# Patient Record
Sex: Female | Born: 2005 | Race: White | Hispanic: No | Marital: Single | Smoking: Never smoker
Health system: Southern US, Community
[De-identification: ages and names within clinical notes are randomized; demographics above are authoritative.]

## PROBLEM LIST (undated history)

## (undated) DIAGNOSIS — Z789 Other specified health status: Secondary | ICD-10-CM

## (undated) DIAGNOSIS — R519 Headache, unspecified: Secondary | ICD-10-CM

## (undated) HISTORY — DX: Headache, unspecified: R51.9

---
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
## 2023-01-08 ENCOUNTER — Telehealth: Payer: Self-pay | Admitting: Family Medicine

## 2023-01-08 DIAGNOSIS — E559 Vitamin D deficiency, unspecified: Secondary | ICD-10-CM

## 2023-01-08 DIAGNOSIS — R519 Headache, unspecified: Secondary | ICD-10-CM

## 2023-01-08 DIAGNOSIS — Z131 Encounter for screening for diabetes mellitus: Secondary | ICD-10-CM

## 2023-01-08 NOTE — Telephone Encounter (Signed)
-----   Message from Alvina Chou sent at 01/07/2023  2:13 PM EDT ----- Regarding: Lab orders for Monday, 6.10.24 Patient is scheduled for CPX labs, please order future labs, Thanks , Camelia Eng

## 2023-01-08 NOTE — Telephone Encounter (Addendum)
Left message for Yvonne Zhang to return my call in regards to upcoming lab appointment.  I have cancelled her lab appointment prior to her physical.  Due to her age no pre-physical labs are needed.

## 2023-01-08 NOTE — Telephone Encounter (Signed)
Labs not done usually routinely at this age group.  If she is interested in sexually-transmitted infection testing we can do this as well as vitamin D testing ahead of time.  Otherwise cancel lab appointment

## 2023-01-09 NOTE — Telephone Encounter (Signed)
Mom called back after speaking with [place].  [place] thought Dr. Ermalene Searing had wanted her to come in for labs prior to her CPE because she wanted do run labs in regards to her headaches she's beem having.   She has been under care of a neurologist since she was 17 years old.    Mom also brought up the diabetes concern again.  Please advise.

## 2023-01-09 NOTE — Telephone Encounter (Signed)
Yvonne Zhang's mom notified about lab appointment not needed prior to appointment.  Mom did ask if her blood sugar would be checked at her appointment because her dad is diabetic.  Will have Dr. Ermalene Searing address this at her appointment.  FYI to Dr. Ermalene Searing.

## 2023-01-10 DIAGNOSIS — Z131 Encounter for screening for diabetes mellitus: Secondary | ICD-10-CM | POA: Insufficient documentation

## 2023-01-10 NOTE — Telephone Encounter (Signed)
Okay, let patient know that I have ordered labs.  She can keep the lab appointment prior to her physical.

## 2023-01-10 NOTE — Telephone Encounter (Signed)
Called and notified patients mother, rescheduled lab appt for 01/21/23 @ 7:30 as her previous appt was canceled.

## 2023-01-10 NOTE — Addendum Note (Signed)
Addended by: Kerby Nora E on: 01/10/2023 09:30 AM   Modules accepted: Orders

## 2023-01-21 ENCOUNTER — Other Ambulatory Visit (INDEPENDENT_AMBULATORY_CARE_PROVIDER_SITE_OTHER): Payer: BC Managed Care – PPO

## 2023-01-21 ENCOUNTER — Other Ambulatory Visit: Payer: BC Managed Care – PPO

## 2023-01-21 DIAGNOSIS — Z131 Encounter for screening for diabetes mellitus: Secondary | ICD-10-CM

## 2023-01-21 DIAGNOSIS — E559 Vitamin D deficiency, unspecified: Secondary | ICD-10-CM

## 2023-01-21 DIAGNOSIS — R519 Headache, unspecified: Secondary | ICD-10-CM

## 2023-01-21 DIAGNOSIS — G8929 Other chronic pain: Secondary | ICD-10-CM

## 2023-01-21 LAB — IBC + FERRITIN
Ferritin: 21.3 ng/mL (ref 10.0–291.0)
Iron: 92 ug/dL (ref 42–145)
Saturation Ratios: 23 % (ref 20.0–50.0)
TIBC: 400.4 ug/dL (ref 250.0–450.0)
Transferrin: 286 mg/dL (ref 212.0–360.0)

## 2023-01-21 LAB — COMPREHENSIVE METABOLIC PANEL
ALT: 30 U/L (ref 0–35)
AST: 25 U/L (ref 0–37)
Albumin: 4.6 g/dL (ref 3.5–5.2)
Alkaline Phosphatase: 51 U/L (ref 47–119)
BUN: 15 mg/dL (ref 6–23)
CO2: 27 mEq/L (ref 19–32)
Calcium: 9.3 mg/dL (ref 8.4–10.5)
Chloride: 103 mEq/L (ref 96–112)
Creatinine, Ser: 0.89 mg/dL (ref 0.40–1.20)
GFR: 94.07 mL/min (ref >60.00)
Glucose, Bld: 94 mg/dL (ref 70–99)
Potassium: 3.8 mEq/L (ref 3.5–5.1)
Sodium: 138 mEq/L (ref 135–145)
Total Bilirubin: 0.9 mg/dL (ref 0.2–1.2)
Total Protein: 7 g/dL (ref 6.0–8.3)

## 2023-01-21 LAB — TSH: TSH: 2.07 u[IU]/mL (ref 0.40–5.00)

## 2023-01-21 LAB — CBC WITH DIFFERENTIAL/PLATELET
Basophils Absolute: 0.1 10*3/uL (ref 0.0–0.1)
Basophils Relative: 0.4 % (ref 0.0–3.0)
Eosinophils Absolute: 0.2 10*3/uL (ref 0.0–0.7)
Eosinophils Relative: 1.4 % (ref 0.0–5.0)
HCT: 40.2 % (ref 36.0–49.0)
Hemoglobin: 13.3 g/dL (ref 12.0–16.0)
Lymphocytes Relative: 26.4 % (ref 24.0–48.0)
Lymphs Abs: 3 10*3/uL (ref 0.7–4.0)
MCHC: 33 g/dL (ref 31.0–37.0)
MCV: 83.7 fl (ref 78.0–98.0)
Monocytes Absolute: 0.8 10*3/uL (ref 0.1–1.0)
Monocytes Relative: 7 % (ref 3.0–12.0)
Neutro Abs: 7.3 10*3/uL (ref 1.4–7.7)
Neutrophils Relative %: 64.8 % (ref 43.0–71.0)
Platelets: 236 10*3/uL (ref 150.0–575.0)
RBC: 4.8 Mil/uL (ref 3.80–5.70)
RDW: 13 % (ref 11.4–15.5)
WBC: 11.4 10*3/uL (ref 4.5–13.5)

## 2023-01-21 LAB — VITAMIN D 25 HYDROXY (VIT D DEFICIENCY, FRACTURES): VITD: 29.54 ng/mL — ABNORMAL LOW (ref 30.00–100.00)

## 2023-01-21 LAB — HEMOGLOBIN A1C: Hgb A1c MFr Bld: 5.6 % (ref 4.6–6.5)

## 2023-01-21 NOTE — Progress Notes (Signed)
No critical labs need to be addressed urgently. We will discuss labs in detail at upcoming office visit.   

## 2023-01-28 ENCOUNTER — Encounter: Payer: BC Managed Care – PPO | Admitting: Family Medicine

## 2023-02-04 ENCOUNTER — Encounter: Payer: Self-pay | Admitting: Family Medicine

## 2023-02-04 ENCOUNTER — Ambulatory Visit (INDEPENDENT_AMBULATORY_CARE_PROVIDER_SITE_OTHER): Payer: BC Managed Care – PPO | Admitting: Family Medicine

## 2023-02-04 VITALS — BP 100/60 | HR 97 | Temp 97.8°F | Ht 67.75 in | Wt 178.4 lb

## 2023-02-04 DIAGNOSIS — G8929 Other chronic pain: Secondary | ICD-10-CM | POA: Diagnosis not present

## 2023-02-04 DIAGNOSIS — E559 Vitamin D deficiency, unspecified: Secondary | ICD-10-CM | POA: Diagnosis not present

## 2023-02-04 DIAGNOSIS — R519 Headache, unspecified: Secondary | ICD-10-CM

## 2023-02-04 DIAGNOSIS — Z0000 Encounter for general adult medical examination without abnormal findings: Secondary | ICD-10-CM

## 2023-02-04 NOTE — Progress Notes (Signed)
Patient ID: Yvonne Zhang, female    DOB: Jun 14, 2006, 17 y.o.   MRN: [id number]  This visit was conducted in person.  BP 100/60 (BP Location: Left Arm, Patient Position: Sitting, Cuff Size: Normal)   Pulse 97   Temp 97.8 F (36.6 C) (Temporal)   Ht 5' 7.75" (1.721 m)   Wt 178 lb 6 oz (80.9 kg)   LMP 02/01/2023   SpO2 97%   BMI 27.32 kg/m    CC:  Chief Complaint  Patient presents with   Annual Exam    Subjective:   HPI: Yvonne Zhang is a 17 y.o. female presenting on 02/04/2023 for Annual Exam  Previous PCP Dr. 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  Started Topamax but had   stop given SE.  Recommended ophthalmologist... had appt 2023 no swelling optic nerve    diagnosed with IIH in 2018. Opening pressure at that time was 29.    She reports no recent headaches.  She is on summer break, relaxing.  Starting to get back into exercise routine.  Relevant past medical, surgical, family and social history reviewed and updated as indicated. Interim medical history since our last visit reviewed. Allergies and medications reviewed and updated. Outpatient Medications Prior to Visit  Medication Sig Dispense Refill   acetaminophen (TYLENOL) 325 MG tablet Take 2 tablets (650 mg total) by mouth every 6 (six) hours as needed.     Ascorbic Acid (VITAMIN C GUMMIE PO) Take 2 each by mouth daily.     fexofenadine (ALLEGRA) 30 MG/5ML suspension Take 60 mg by mouth daily as needed (for allergies).     ibuprofen (ADVIL,MOTRIN) 200 MG tablet Take 2 tablets (400 mg total) by mouth every 6 (six) hours as needed (for headaches or pain). 30 tablet 0   rizatriptan  (MAXALT) 10 MG tablet Take 1 tablet (10 mg total) by mouth as needed. May repeat in 2 hours if needed. Max dose 2 pills in 24 hours 10 tablet 6   Cholecalciferol (VITAMIN D3) 2000 units TABS Take 1 tablet by mouth daily.     No facility-administered medications prior to visit.     Per HPI unless specifically indicated in ROS section below Review of Systems  Constitutional:  Negative for fatigue and fever.  HENT:  Negative for ear pain.   Eyes:  Negative for pain.  Respiratory:  Negative for chest tightness and shortness of breath.   Cardiovascular:  Negative for chest pain, palpitations and leg swelling.  Gastrointestinal:  Negative for abdominal pain.  Genitourinary:  Negative for dysuria.   Objective:  BP 100/60 (BP Location: Left Arm, Patient Position: Sitting, Cuff Size: Normal)   Pulse 97   Temp 97.8 F (36.6 C) (Temporal)   Ht 5' 7.75" (1.721 m)   Wt 178 lb 6 oz (80.9 kg)   LMP 02/01/2023   SpO2 97%   BMI 27.32 kg/m   Wt Readings from Last 3 Encounters:  02/04/23 178 lb 6 oz (80.9 kg) (94 %, Z= 1.58)*  07/29/22 166 lb 12.8 oz (75.7 kg) (91 %, Z= 1.37)*  07/26/22 164 lb 6 oz (74.6 kg) (91 %, Z= 1.32)*   *  Growth percentiles are based on CDC (Girls, 2-20 Years) data.      Physical Exam Vitals and nursing note reviewed.  Constitutional:      General: She is not in acute distress.    Appearance: Normal appearance. She is well-developed. She is not ill-appearing or toxic-appearing.  HENT:     Head: Normocephalic.     Right Ear: Hearing, tympanic membrane, ear canal and external ear normal.     Left Ear: Hearing, tympanic membrane, ear canal and external ear normal.     Nose: Nose normal.  Eyes:     General: Lids are normal. Lids are everted, no foreign bodies appreciated.     Conjunctiva/sclera: Conjunctivae normal.     Pupils: Pupils are equal, round, and reactive to light.  Neck:     Thyroid: No thyroid mass or thyromegaly.     Vascular: No carotid bruit.      Trachea: Trachea normal.  Cardiovascular:     Rate and Rhythm: Normal rate and regular rhythm.     Heart sounds: Normal heart sounds, S1 normal and S2 normal. No murmur heard.    No gallop.  Pulmonary:     Effort: Pulmonary effort is normal. No respiratory distress.     Breath sounds: Normal breath sounds. No wheezing, rhonchi or rales.  Abdominal:     General: Bowel sounds are normal. There is no distension or abdominal bruit.     Palpations: Abdomen is soft. There is no fluid wave or mass.     Tenderness: There is no abdominal tenderness. There is no guarding or rebound.     Hernia: No hernia is present.  Musculoskeletal:     Cervical back: Normal range of motion and neck supple.  Lymphadenopathy:     Cervical: No cervical adenopathy.  Skin:    General: Skin is warm and dry.     Findings: No rash.  Neurological:     Mental Status: She is alert.     Cranial Nerves: No cranial nerve deficit.     Sensory: No sensory deficit.  Psychiatric:        Mood and Affect: Mood is not anxious or depressed.        Speech: Speech normal.        Behavior: Behavior normal. Behavior is cooperative.        Judgment: Judgment normal.       Results for orders placed or performed in visit on 01/21/23  TSH  Result Value Ref Range   TSH 2.07 0.40 - 5.00 uIU/mL  Hemoglobin A1c  Result Value Ref Range   Hgb A1c MFr Bld 5.6 4.6 - 6.5 %  Comprehensive metabolic panel  Result Value Ref Range   Sodium 138 135 - 145 mEq/L   Potassium 3.8 3.5 - 5.1 mEq/L   Chloride 103 96 - 112 mEq/L   CO2 27 19 - 32 mEq/L   Glucose, Bld 94 70 - 99 mg/dL   BUN 15 6 - 23 mg/dL   Creatinine, Ser 0.86 0.40 - 1.20 mg/dL   Total Bilirubin 0.9 0.2 - 1.2 mg/dL   Alkaline Phosphatase 51 47 - 119 U/L   AST 25 0 - 37 U/L   ALT 30 0 - 35 U/L   Total Protein 7.0 6.0 - 8.3 g/dL   Albumin 4.6 3.5 - 5.2 g/dL   GFR 57.84 >69.62 mL/min   Calcium 9.3 8.4 - 10.5 mg/dL  VITAMIN D 25 Hydroxy (Vit-D Deficiency, Fractures)   Result Value  Ref Range   VITD 29.54 (L) 30.00 - 100.00 ng/mL  IBC + Ferritin  Result Value Ref Range   Iron 92 42 - 145 ug/dL   Transferrin 811.9 147.8 - 360.0 mg/dL   Saturation Ratios 29.5 20.0 - 50.0 %   Ferritin 21.3 10.0 - 291.0 ng/mL   TIBC 400.4 250.0 - 450.0 mcg/dL  CBC with Differential/Platelet  Result Value Ref Range   WBC 11.4 4.5 - 13.5 K/uL   RBC 4.80 3.80 - 5.70 Mil/uL   Hemoglobin 13.3 12.0 - 16.0 g/dL   HCT 62.1 30.8 - 65.7 %   MCV 83.7 78.0 - 98.0 fl   MCHC 33.0 31.0 - 37.0 g/dL   RDW 84.6 96.2 - 95.2 %   Platelets 236.0 150.0 - 575.0 K/uL   Neutrophils Relative % 64.8 43.0 - 71.0 %   Lymphocytes Relative 26.4 24.0 - 48.0 %   Monocytes Relative 7.0 3.0 - 12.0 %   Eosinophils Relative 1.4 0.0 - 5.0 %   Basophils Relative 0.4 0.0 - 3.0 %   Neutro Abs 7.3 1.4 - 7.7 K/uL   Lymphs Abs 3.0 0.7 - 4.0 K/uL   Monocytes Absolute 0.8 0.1 - 1.0 K/uL   Eosinophils Absolute 0.2 0.0 - 0.7 K/uL   Basophils Absolute 0.1 0.0 - 0.1 K/uL     COVID 19 screen:  No recent travel or known exposure to COVID19 The patient denies respiratory symptoms of COVID 19 at this time. The importance of social distancing was discussed today.   Assessment and Plan The patient's preventative maintenance and recommended screening tests for an annual wellness exam were reviewed in full today. Brought up to date unless services declined.  Counselled on the importance of diet, exercise, and its role in overall health and mortality. The patient's FH and SH was reviewed, including their home life, tobacco status, and drug and alcohol status.   Vaccines: Up-to-date with tetanus, flu, COVID vaccine  Pap/DVE: Not indicated till age 29 Mammo: Not indicated  Smoking Status: Non-smoker ETOH/ drug use: None/none  Hep C: Consider at next lab draw  HIV screen: Patient not sexually active. No concern about STD.    Problem List Items Addressed This Visit     Chronic headache disorder     Chronic,  Rare headache.      Vitamin D deficiency     Can  use vi d supplement.      Other Visit Diagnoses     Routine general medical examination at a health care facility    -  Primary       Kerby Nora, MD

## 2023-02-04 NOTE — Assessment & Plan Note (Signed)
Chronic, Rare headache.

## 2023-02-04 NOTE — Assessment & Plan Note (Signed)
Can  use vi d supplement.

## 2023-03-10 NOTE — Progress Notes (Unsigned)
   CC:  headaches  Follow-up Visit  Last visit: 07/29/22  Brief HPI: 17 year old female with a history of IIH (2018, OP 27) who follows in clinic for headaches. MRI brain 12/11/21 was unremarkable. Ophthalmology exam last year was negative for papilledema.   At her last visit she was started on Maxalt for rescue. Interval History: ***   Headache days per month: *** Migraine days per month*** Headache free days per month: ***  Current Headache Regimen: Preventative: *** Abortive: ***   Prior Therapies                                  Advil Maxalt 10 mg PRN Diamox Topamax - paresthesias  Physical Exam:   Vital Signs: There were no vitals taken for this visit. GENERAL:  well appearing, in no acute distress, alert  SKIN:  Color, texture, turgor normal. No rashes or lesions HEAD:  Normocephalic/atraumatic. RESP: normal respiratory effort MSK:  No gross joint deformities.   NEUROLOGICAL: Mental Status: Alert, oriented to person, place and time, Follows commands, and Speech fluent and appropriate. Cranial Nerves: PERRL, face symmetric, no dysarthria, hearing grossly intact Motor: moves all extremities equally Gait: normal-based.  IMPRESSION: ***  PLAN: ***   Follow-up: ***  I spent a total of *** minutes on the date of the service. Headache education was done. Discussed lifestyle modification including increased oral hydration, decreased caffeine, exercise and stress management. Discussed treatment options including preventive and acute medications, natural supplements, and infusion therapy. Discussed medication overuse headache and to limit use of acute treatments to no more than 2 days/week or 10 days/month. Discussed medication side effects, adverse reactions and drug interactions. Written educational materials and patient instructions outlining all of the above were given.  Ocie Doyne, MD

## 2023-03-11 ENCOUNTER — Ambulatory Visit: Payer: BC Managed Care – PPO | Admitting: Psychiatry

## 2023-03-11 ENCOUNTER — Encounter: Payer: Self-pay | Admitting: Psychiatry

## 2023-03-11 VITALS — BP 111/60 | HR 63 | Ht 67.0 in | Wt 184.6 lb

## 2023-03-11 DIAGNOSIS — G43009 Migraine without aura, not intractable, without status migrainosus: Secondary | ICD-10-CM | POA: Diagnosis not present

## 2023-03-11 MED ORDER — RIZATRIPTAN BENZOATE 10 MG PO TABS: 10.0000 mg | ORAL_TABLET | ORAL | 6 refills | Status: DC | PRN

## 2023-03-18 IMAGING — MR MR KNEE*L* W/O CM
4 of 6 series · 23 of 40 positions shown · non-contrast
Comparison: None.

CLINICAL DATA: Left knee pain due to a soccer injury on [DATE],
and notes unable to straighten without any pain to occur.

EXAM:
MRI OF THE LEFT KNEE WITHOUT CONTRAST
TECHNIQUE: Multiplanar, multisequence MR imaging of the knee was performed. No
intravenous contrast was administered.

[Series 10: T2 fat-sat · coronal · 4.0mm · 0.59mm/px · 6 of 26 slices shown (1 of 2)]
[im 1/26]
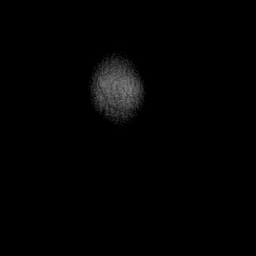
[im 6/26]
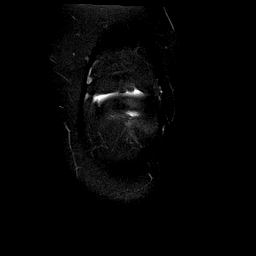
[im 11/26]
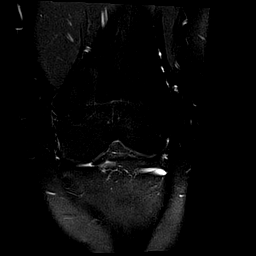
[im 16/26]
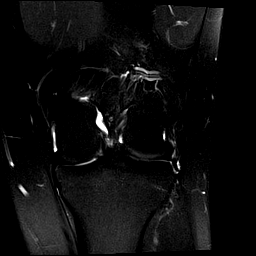
[im 21/26]
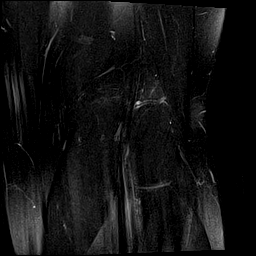
[im 26/26]
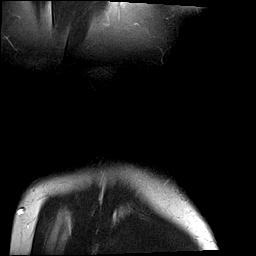

[Series 11: T1 · coronal · 4.0mm · 0.29mm/px · 3 of 26 slices shown]
[im 6/26]
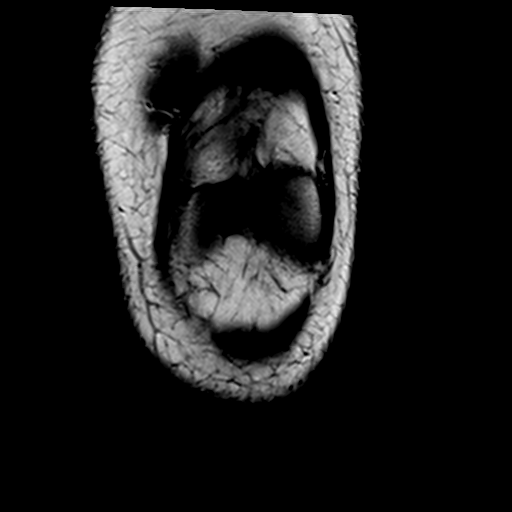
[im 16/26]
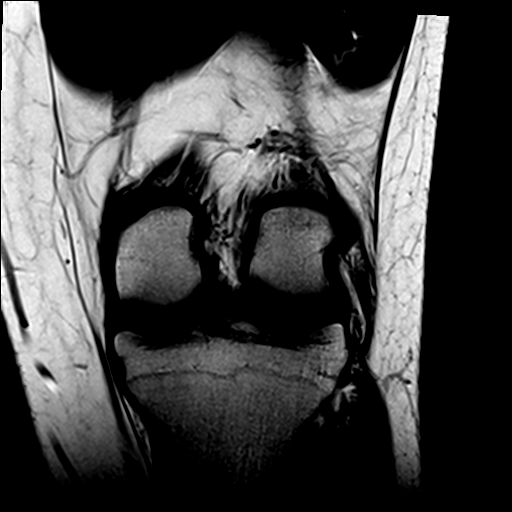
[im 26/26]
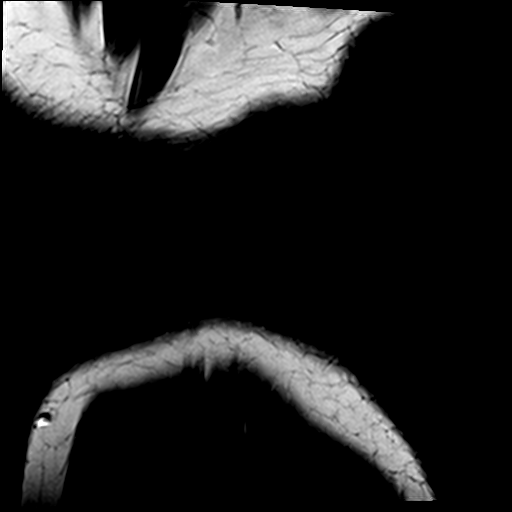

[Series 13: PD fat-sat · sagittal · 3.0mm · 0.29mm/px · 7 of 30 slices shown]
[im 1/30]
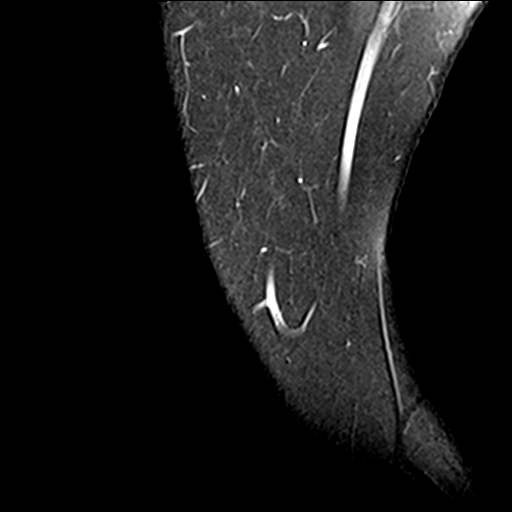
[im 5/30]
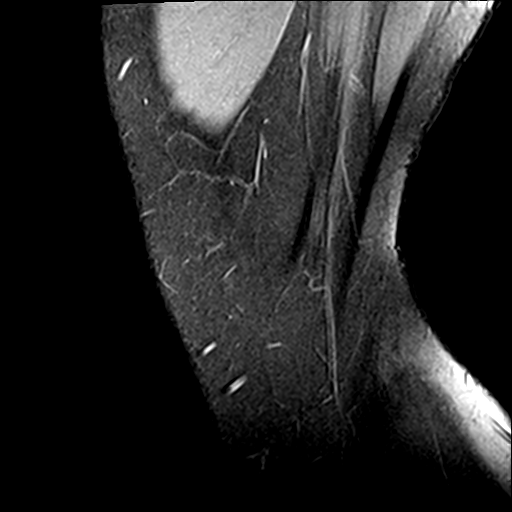
[im 10/30]
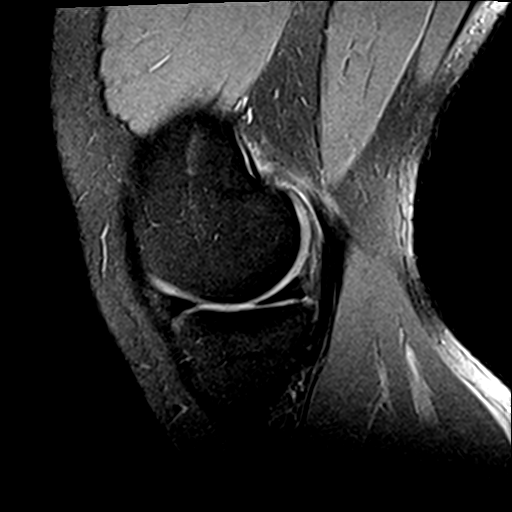
[im 15/30]
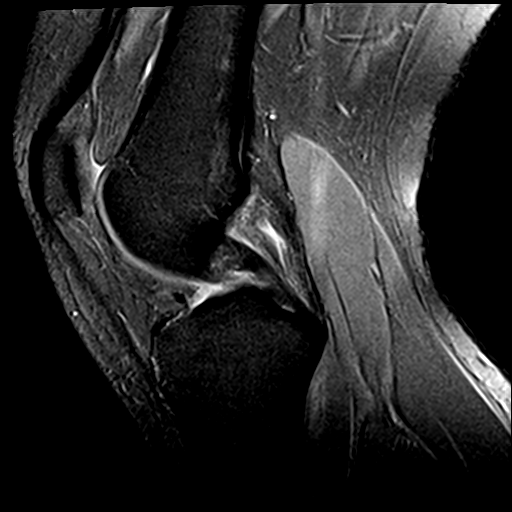
[im 20/30]
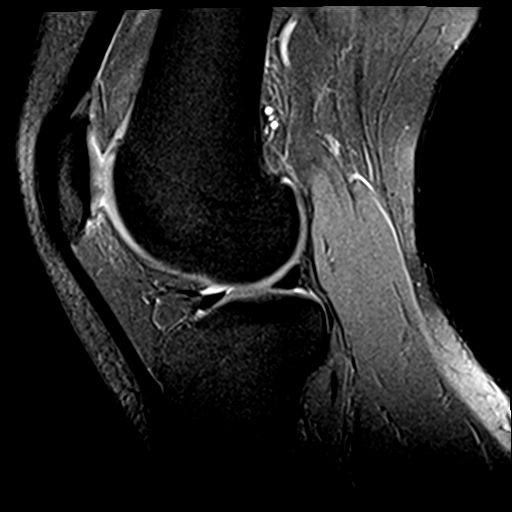
[im 25/30]
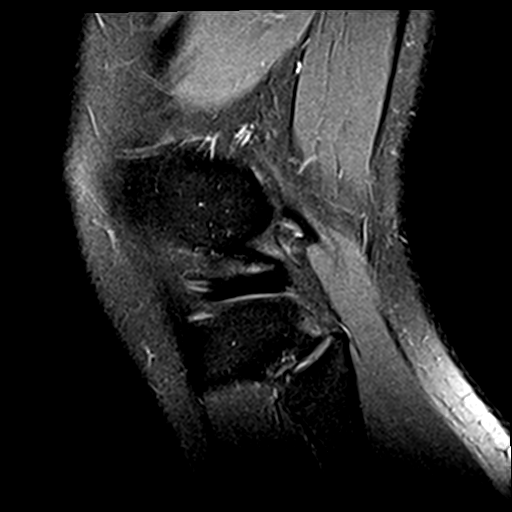
[im 30/30]
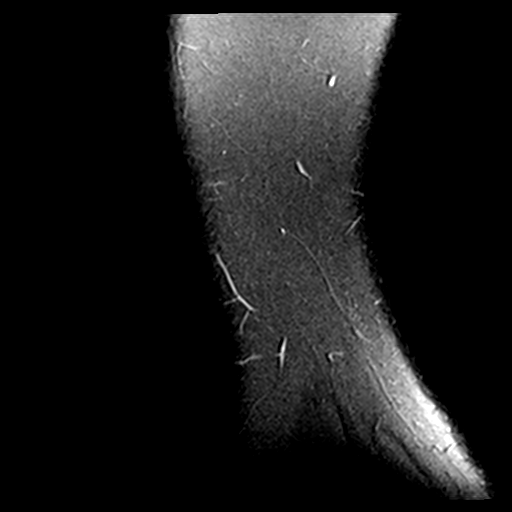

[Series 14: T2 fat-sat · sagittal · 3.0mm · 0.29mm/px · 7 of 30 slices shown (2 of 2)]
[im 1/30]
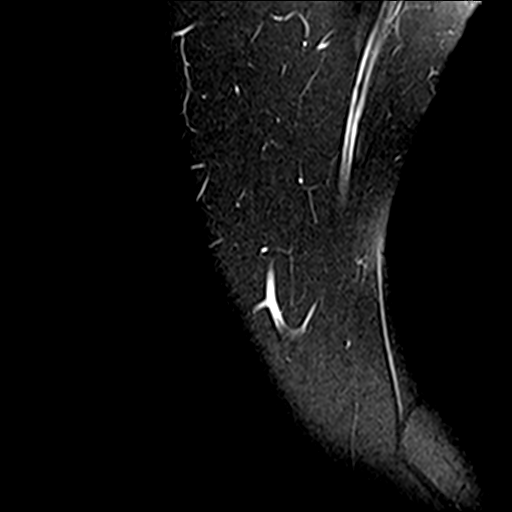
[im 5/30]
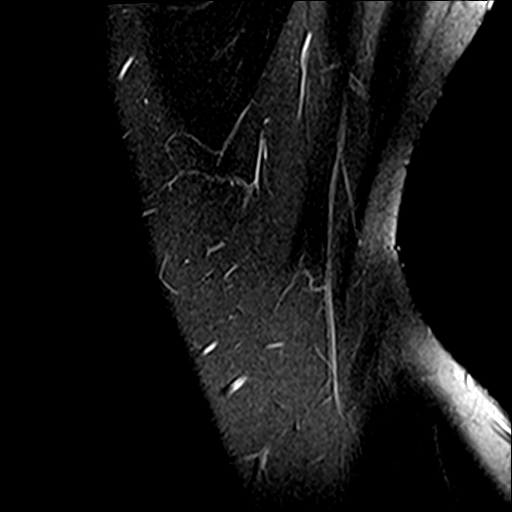
[im 10/30]
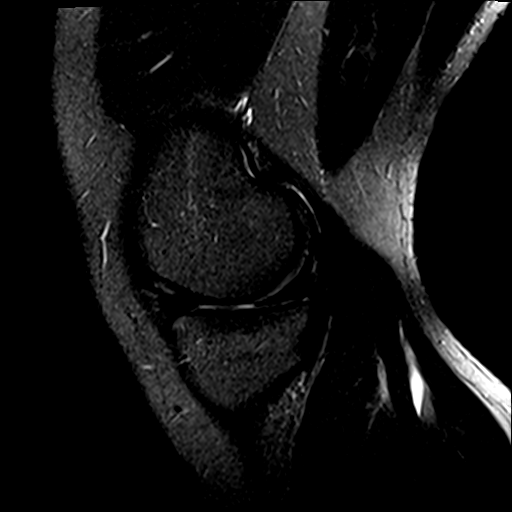
[im 15/30]
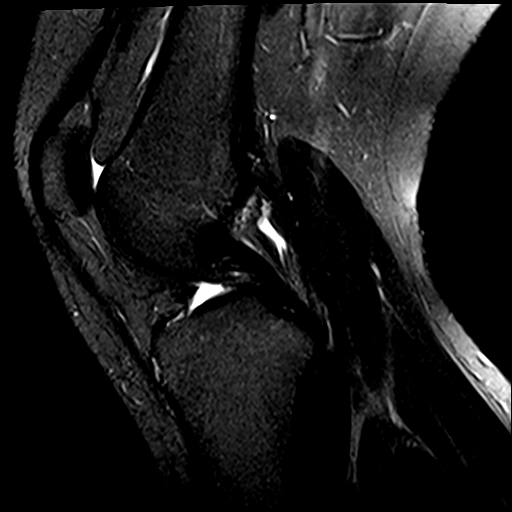
[im 20/30]
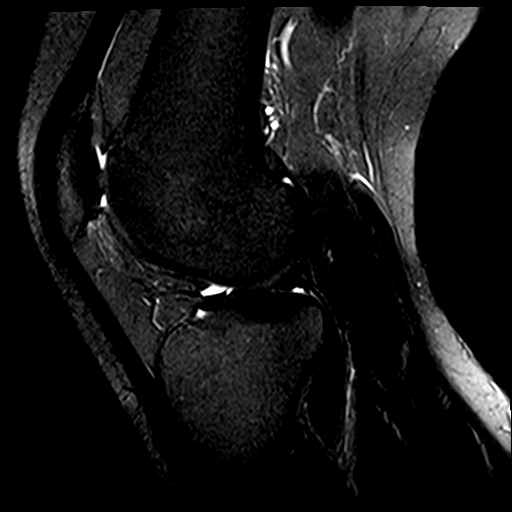
[im 25/30]
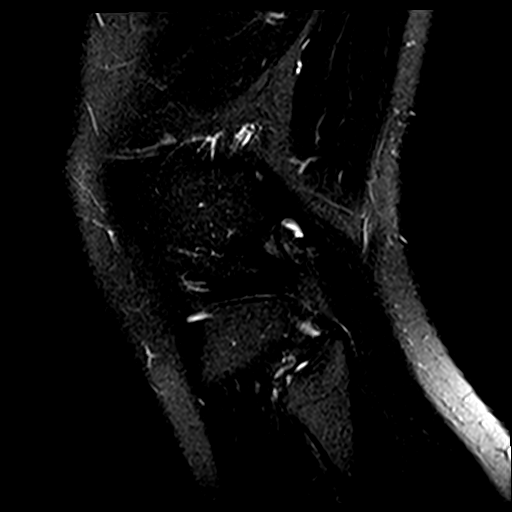
[im 30/30]
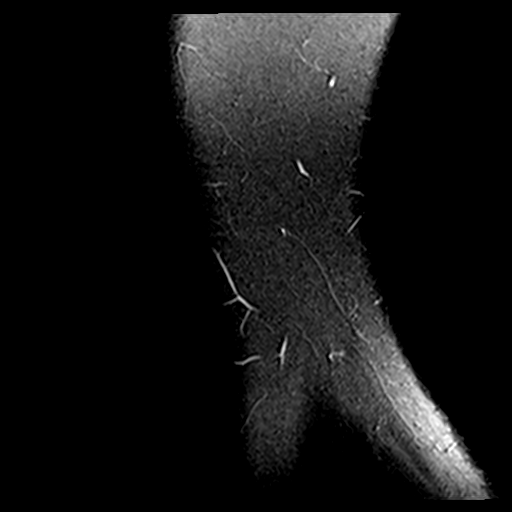

[23 of 40 positions shown; findings below may reference images not displayed]

FINDINGS: MENISCI

Medial: Intact.

Lateral: Intact.

LIGAMENTS

Cruciates: ACL and PCL are intact.

Collaterals: Medial collateral ligament is intact. Lateral
collateral ligament complex is intact.

CARTILAGE

Patellofemoral:  No chondral defect.

Medial:  No chondral defect.

Lateral:  No chondral defect.

JOINT: No joint effusion. Normal Oviraj Bassu. No plical
thickening.

POPLITEAL FOSSA: Popliteus tendon is intact. No Baker's cyst.

EXTENSOR MECHANISM: Intact quadriceps tendon. Intact patellar
tendon. Intact lateral patellar retinaculum. Intact medial patellar
retinaculum. Intact MPFL.

BONES: No aggressive osseous lesion. No fracture or dislocation.

Other: No fluid collection or hematoma. Muscles are normal.
IMPRESSION: Normal MRI of the left knee.

## 2023-06-16 DIAGNOSIS — G43819 Other migraine, intractable, without status migrainosus: Secondary | ICD-10-CM | POA: Diagnosis not present

## 2023-06-16 DIAGNOSIS — J069 Acute upper respiratory infection, unspecified: Secondary | ICD-10-CM | POA: Diagnosis not present

## 2023-06-17 DIAGNOSIS — K529 Noninfective gastroenteritis and colitis, unspecified: Secondary | ICD-10-CM | POA: Diagnosis not present

## 2023-06-20 DIAGNOSIS — R112 Nausea with vomiting, unspecified: Secondary | ICD-10-CM | POA: Diagnosis not present

## 2023-06-20 DIAGNOSIS — R509 Fever, unspecified: Secondary | ICD-10-CM | POA: Diagnosis not present

## 2023-06-20 DIAGNOSIS — J189 Pneumonia, unspecified organism: Secondary | ICD-10-CM | POA: Diagnosis not present

## 2023-06-20 DIAGNOSIS — J157 Pneumonia due to Mycoplasma pneumoniae: Secondary | ICD-10-CM | POA: Diagnosis not present

## 2023-06-20 DIAGNOSIS — R7981 Abnormal blood-gas level: Secondary | ICD-10-CM | POA: Diagnosis not present

## 2023-06-20 DIAGNOSIS — R058 Other specified cough: Secondary | ICD-10-CM | POA: Diagnosis not present

## 2023-06-20 DIAGNOSIS — R110 Nausea: Secondary | ICD-10-CM | POA: Diagnosis not present

## 2023-06-20 DIAGNOSIS — R051 Acute cough: Secondary | ICD-10-CM | POA: Diagnosis not present

## 2023-06-20 DIAGNOSIS — R059 Cough, unspecified: Secondary | ICD-10-CM | POA: Diagnosis not present

## 2023-06-23 DIAGNOSIS — J157 Pneumonia due to Mycoplasma pneumoniae: Secondary | ICD-10-CM | POA: Diagnosis not present

## 2023-06-25 ENCOUNTER — Telehealth: Payer: Self-pay | Admitting: Family Medicine

## 2023-06-25 DIAGNOSIS — M419 Scoliosis, unspecified: Secondary | ICD-10-CM | POA: Diagnosis not present

## 2023-06-25 DIAGNOSIS — J157 Pneumonia due to Mycoplasma pneumoniae: Secondary | ICD-10-CM | POA: Diagnosis not present

## 2023-06-25 NOTE — Telephone Encounter (Signed)
Patient's mom contacted the office, states patient currently has walking pneumonia. Mom says patient went to a health center near school she goes to and that is where this was determined. Health enter has suggested for patient to have another xray between 12/6 and 08/18/2023 to ensure the lungs have cleared. Patient's mom wants to know if patient could have that done here at the office? Wanted to know if Ermalene Searing would want to see patient first to follow up on the symptoms and then order the xray or if Dr. Ermalene Searing could just place the order for her. Mom also says that with the xray they did at the health center they determined that the patient also has scoliosis. Please advise patient's mom with response, okay per DPR.

## 2023-06-25 NOTE — Telephone Encounter (Signed)
I would suggest a follow-up appointment for reassessment  and I can order the chest x-ray at that time.

## 2023-06-25 NOTE — Telephone Encounter (Signed)
We were not able to get scheduled while she is home. I have set her up for appointment on 1/7. She will go to original location for follow up x ray. Mom was concerned that her it showed scoliosis. Mom wanted to know if anything needs to be done about it. Xray report in care everywhere.

## 2023-06-26 NOTE — Telephone Encounter (Signed)
Mrs. Wilbur notified as instructed by telephone.

## 2023-06-26 NOTE — Telephone Encounter (Signed)
I appears the type of scoliosis she is very mild.  Not likely to cause issus but if  chronic back pain let me know.  She should work on core strengthening and posture.

## 2023-08-10 NOTE — Progress Notes (Unsigned)
Chief Complaint  Patient presents with   Follow-up    Pt in room 1 alone.Here for migraine follow up. Pt reports migraines have gotten worse. Pt said she received toradol shot at student health. Pt had 3-4 headaches in last 2 weeks. Pt said she noticed when driving here from school a headaches after couple hours of driving (school is 8 hours away.)    HISTORY OF PRESENT ILLNESS:  08/11/23 ALL:  Yvonne Zhang is a 17 y.o. female here today for follow up for migraines. She was last seen by Dr Delena Bali 02/2023 and reported migraines were well managed on rizatriptan as needed.  She reports an intractable headache 05/2023 not responsive to rizatriptan. She was seen by school MD and headache aborted with Toradol. She reports doing well until the past two weeks. She has had 4 headaches over the past two weeks. She took rizatriptan once and it was effective. She did not bring it home with her for Christmas break. She is currently home from college. 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 No recent ophthalmology exam. No vision changes.    HISTORY (copied from Dr Quentin Mulling previous note)  17 year old female with a history of IIH (2018, OP 82) who follows in clinic for headaches. MRI brain 12/11/21 was unremarkable. Ophthalmology exam in 2022 was negative for papilledema.    At her last visit she was started on Maxalt for rescue.   Interval History: Headaches are improved since her last visit. She has not had a migraine for the past couple of months. Maxalt helps for rescue and she has not had to take a second dose. It does not cause side effects. She does not have any vision changes.     Migraine days per month: 1 Headache free days per month: 29   Current Headache Regimen: Preventative: none Abortive: Maxalt 10 mg PRN     Prior Therapies                                  Advil Maxalt 10 mg PRN Diamox Topamax - paresthesias   REVIEW OF SYSTEMS: Out of a complete 14 system review of symptoms, the patient  complains only of the following symptoms, headaches and all other reviewed systems are negative.   ALLERGIES: Allergies  Allergen Reactions   Other     Seasonal Allergies       HOME MEDICATIONS: Outpatient Medications Prior to Visit  Medication Sig Dispense Refill   acetaminophen (TYLENOL) 325 MG tablet Take 2 tablets (650 mg total) by mouth every 6 (six) hours as needed.     Ascorbic Acid (VITAMIN C GUMMIE PO) Take 2 each by mouth daily.     fexofenadine (ALLEGRA) 30 MG/5ML suspension Take 60 mg by mouth daily as needed (for allergies).     ibuprofen (ADVIL,MOTRIN) 200 MG tablet Take 2 tablets (400 mg total) by mouth every 6 (six) hours as needed (for headaches or pain). 30 tablet 0   rizatriptan (MAXALT) 10 MG tablet Take 1 tablet (10 mg total) by mouth as needed. May repeat in 2 hours if needed. Max dose 2 pills in 24 hours 10 tablet 6   No facility-administered medications prior to visit.     PAST MEDICAL HISTORY: Past Medical History:  Diagnosis Date   Headache    Medical history non-contributory      PAST SURGICAL HISTORY: History reviewed. No pertinent surgical history.  FAMILY HISTORY: Family History  Problem Relation Age of Onset   Diabetes Mother    Hypertension Father    Diabetes Father    Hyperlipidemia Father    Hearing loss Maternal Grandmother    Anxiety disorder Maternal Grandmother    Stroke Maternal Grandmother    Hearing loss Maternal Grandfather    Heart attack Paternal Grandmother    Hearing loss Paternal Grandfather      SOCIAL HISTORY: Social History   Socioeconomic History   Marital status: Single    Spouse name: Not on file   Number of children: 0   Years of education: Not on file   Highest education level: Not on file  Occupational History   Not on file  Tobacco Use   Smoking status: Never   Smokeless tobacco: Never  Substance and Sexual Activity   Alcohol use: No   Drug use: No   Sexual activity: Never  Other Topics  Concern   Not on file  Social History Narrative   Yvonne  goes to [employer], in first year. She is studying  Haematologist, minor in psychology.    Diet:  varied but still fairly limited    Exercise: plays soccer   Social Drivers of Health   Financial Resource Strain: Not on file  Food Insecurity: Not on file  Transportation Needs: Not on file  Physical Activity: Not on file  Stress: Not on file  Social Connections: Unknown (01/28/2023)   Received from [facility], [facility]   Social Connections    How often do you feel lonely or isolated from those around you? (Adult - for ages 18 years and over): Not on file  Intimate Partner Violence: Not on file     PHYSICAL EXAM  Vitals:   08/11/23 1253  BP: 112/73  Pulse: 81  Weight: 179 lb 8 oz (81.4 kg)  Height: 5\' 7"  (1.702 m)   Body mass index is 28.11 kg/m.  Generalized: Well developed, in no acute distress  Cardiology: normal rate and rhythm, no murmur auscultated  Respiratory: clear to auscultation bilaterally    Neurological examination  Mentation: Alert oriented to time, place, history taking. Follows all commands speech and language fluent Cranial nerve II-XII: Pupils were equal round reactive to light. Extraocular movements were full, visual field were full on confrontational test. Facial sensation and strength were normal. Uvula tongue midline. Head turning and shoulder shrug  were normal and symmetric. Motor: The motor testing reveals 5 over 5 strength of all 4 extremities. Good symmetric motor tone is noted throughout.  Gait and station: Gait is normal.    DIAGNOSTIC DATA (LABS, IMAGING, TESTING) - I reviewed patient records, labs, notes, testing and imaging myself where available.  Lab Results  Component Value Date   WBC 11.4 01/21/2023   HGB 13.3 01/21/2023   HCT 40.2 01/21/2023   MCV 83.7 01/21/2023   PLT 236.0 01/21/2023      Component Value Date/Time   NA 138 01/21/2023 0735   K 3.8  01/21/2023 0735   CL 103 01/21/2023 0735   CO2 27 01/21/2023 0735   GLUCOSE 94 01/21/2023 0735   BUN 15 01/21/2023 0735   CREATININE 0.89 01/21/2023 0735   CALCIUM 9.3 01/21/2023 0735   PROT 7.0 01/21/2023 0735   ALBUMIN 4.6 01/21/2023 0735   AST 25 01/21/2023 0735   ALT 30 01/21/2023 0735   ALKPHOS 51 01/21/2023 0735   BILITOT 0.9 01/21/2023 0735   GFRNONAA NOT CALCULATED 10/12/2016 1609  GFRAA NOT CALCULATED 10/12/2016 1609   No results found for: "CHOL", "HDL", "LDLCALC", "LDLDIRECT", "TRIG", "CHOLHDL" Lab Results  Component Value Date   HGBA1C 5.6 01/21/2023   No results found for: "VITAMINB12" Lab Results  Component Value Date   TSH 2.07 01/21/2023        No data to display               No data to display           ASSESSMENT AND PLAN  17 y.o. year old female  has a past medical history of Headache and Medical history non-contributory. here with    Migraine without aura and without status migrainosus, not intractable  Yvonne Zhang reports waxing and waning of headaches, but, overall fairly stable. We will try sumatriptan to see if this may be more effective. Appropriate dosing and possible side effects reviewed. May consider supplements if needed. Healthy lifestyle habits encouraged. She will follow up with PCP as directed. She will return to see me in 1 year, sooner if needed. She verbalizes understanding and agreement with this plan.   No orders of the defined types were placed in this encounter.   Meds ordered this encounter  Medications   SUMAtriptan (IMITREX) 100 MG tablet    Sig: Take 1 tablet (100 mg total) by mouth once as needed for up to 1 dose for migraine. May repeat in 2 hours if headache persists or recurs.    Dispense:  10 tablet    Refill:  11    Supervising Provider:   Anson Fret [[id number]]      Shawnie Dapper, MSN, FNP-C 08/11/2023, 1:57 PM  [facility] [address], [address] [place], [place]  [address] [phone]

## 2023-08-10 NOTE — Patient Instructions (Signed)
Below is our plan:  We will switch rizatriptan to sumatriptan. Please take 1 tablet at onset of headache. May take 1 additional tablet in 2 hours if needed. Do not take more than 2 tablets in 24 hours or more than 10 in a month.   Please make sure you are staying well hydrated. I recommend 50-60 ounces daily. Well balanced diet and regular exercise encouraged. Consistent sleep schedule with 6-8 hours recommended.   Please continue follow up with care team as directed.   Follow up with me in 1 year   You may receive a survey regarding today's visit. I encourage you to leave honest feed back as I do use this information to improve patient care. Thank you for seeing me today!   GENERAL HEADACHE INFORMATION:   Natural supplements: Magnesium Oxide or Magnesium Glycinate 500 mg at bed (up to 800 mg daily) Coenzyme Q10 300 mg in AM Vitamin B2- 200 mg twice a day   Add 1 supplement at a time since even natural supplements can have undesirable side effects. You can sometimes buy supplements cheaper (especially Coenzyme Q10) at www.WebmailGuide.co.za or at [facility].  Migraine with aura: There is increased risk for stroke in women with migraine with aura and a contraindication for the combined contraceptive pill for use by women who have migraine with aura. The risk for women with migraine without aura is lower. However other risk factors like smoking are far more likely to increase stroke risk than migraine. There is a recommendation for no smoking and for the use of OCPs without estrogen such as progestogen only pills particularly for women with migraine with aura.Marland Kitchen People who have migraine headaches with auras may be 3 times more likely to have a stroke caused by a blood clot, compared to migraine patients who don't see auras. Women who take hormone-replacement therapy may be 30 percent more likely to suffer a clot-based stroke than women not taking medication containing estrogen. Other risk factors like smoking  and high blood pressure may be  much more important.    Vitamins and herbs that show potential:   Magnesium: Magnesium (250 mg twice a day or 500 mg at bed) has a relaxant effect on smooth muscles such as blood vessels. Individuals suffering from frequent or daily headache usually have low magnesium levels which can be increase with daily supplementation of 400-750 mg. Three trials found 40-90% average headache reduction  when used as a preventative. Magnesium may help with headaches are aura, the best evidence for magnesium is for migraine with aura is its thought to stop the cortical spreading depression we believe is the pathophysiology of migraine aura.Magnesium also demonstrated the benefit in menstrually related migraine.  Magnesium is part of the messenger system in the serotonin cascade and it is a good muscle relaxant.  It is also useful for constipation which can be a side effect of other medications used to treat migraine. Good sources include nuts, whole grains, and tomatoes. Side Effects: loose stool/diarrhea  Riboflavin (vitamin B 2) 200 mg twice a day. This vitamin assists nerve cells in the production of ATP a principal energy storing molecule.  It is necessary for many chemical reactions in the body.  There have been at least 3 clinical trials of riboflavin using 400 mg per day all of which suggested that migraine frequency can be decreased.  All 3 trials showed significant improvement in over half of migraine sufferers.  The supplement is found in bread, cereal, milk, meat, and poultry.  Most Americans get more riboflavin than the recommended daily allowance, however riboflavin deficiency is not necessary for the supplements to help prevent headache. Side effects: energizing, green urine   Coenzyme Q10: This is present in almost all cells in the body and is critical component for the conversion of energy.  Recent studies have shown that a nutritional supplement of CoQ10 can reduce the  frequency of migraine attacks by improving the energy production of cells as with riboflavin.  Doses of 150 mg twice a day have been shown to be effective.   Melatonin: Increasing evidence shows correlation between melatonin secretion and headache conditions.  Melatonin supplementation has decreased headache intensity and duration.  It is widely used as a sleep aid.  Sleep is natures way of dealing with migraine.  A dose of 3 mg is recommended to start for headaches including cluster headache. Higher doses up to 15 mg has been reviewed for use in Cluster headache and have been used. The rationale behind using melatonin for cluster is that many theories regarding the cause of Cluster headache center around the disruption of the normal circadian rhythm in the brain.  This helps restore the normal circadian rhythm.   HEADACHE DIET: Foods and beverages which may trigger migraine Note that only 20% of headache patients are food sensitive. You will know if you are food sensitive if you get a headache consistently 20 minutes to 2 hours after eating a certain food. Only cut out a food if it causes headaches, otherwise you might remove foods you enjoy! What matters most for diet is to eat a well balanced healthy diet full of vegetables and low fat protein, and to not miss meals.   Chocolate, other sweets ALL cheeses except cottage and cream cheese Dairy products, yogurt, sour cream, ice cream Liver Meat extracts (Bovril, Marmite, meat tenderizers) Meats or fish which have undergone aging, fermenting, pickling or smoking. These include: Hotdogs,salami,Lox,sausage, mortadellas,smoked salmon, pepperoni, Pickled herring Pods of broad bean (English beans, Chinese pea pods, [place] (fava) beans, lima and navy beans Ripe avocado, ripe banana Yeast extracts or active yeast preparations such as Brewer's or Fleishman's (commercial bakes goods are permitted) Tomato based foods, pizza (lasagna, etc.)   MSG  (monosodium glutamate) is disguised as many things; look for these common aliases: Monopotassium glutamate Autolysed yeast Hydrolysed protein Sodium caseinate "flavorings" "all natural preservatives" Nutrasweet   Avoid all other foods that convincingly provoke headaches.   Resources: The Dizzy [place] Laundry Your Headache Diet, migrainestrong.com  https://zamora-andrews.com/   Caffeine and Migraine For patients that have migraine, caffeine intake more than 3 days per week can lead to dependency and increased migraine frequency. I would recommend cutting back on your caffeine intake as best you can. The recommended amount of caffeine is 200-300 mg daily, although migraine patients may experience dependency at even lower doses. While you may notice an increase in headache temporarily, cutting back will be helpful for headaches in the long run. For more information on caffeine and migraine, visit: https://americanmigrainefoundation.org/resource-library/caffeine-and-migraine/   Headache Prevention Strategies:   1. Maintain a headache diary; learn to identify and avoid triggers.  - This can be a simple note where you log when you had a headache, associated symptoms, and medications used - There are several smartphone apps developed to help track migraines: Migraine Buddy, Migraine Monitor, Curelator N1-Headache App   Common triggers include: Emotional triggers: Emotional/Upset family or friends Emotional/Upset occupation Business reversal/success Anticipation anxiety Crisis-serious Post-crisis periodNew job/position   Physical triggers: Vacation Day  Weekend Strenuous Exercise High Altitude Location New Move Menstrual Day Physical Illness Oversleep/Not enough sleep Weather changes Light: Photophobia or light sesnitivity treatment involves a balance between desensitization and reduction in overly strong input. Use dark polarized glasses  outside, but not inside. Avoid bright or fluorescent light, but do not dim environment to the point that going into a normally lit room hurts. Consider FL-41 tint lenses, which reduce the most irritating wavelengths without blocking too much light.  These can be obtained at axonoptics.com or theraspecs.com Foods: see list above.   2. Limit use of acute treatments (over-the-counter medications, triptans, etc.) to no more than 2 days per week or 10 days per month to prevent medication overuse headache (rebound headache).     3. Follow a regular schedule (including weekends and holidays): Don't skip meals. Eat a balanced diet. 8 hours of sleep nightly. Minimize stress. Exercise 30 minutes per day. Being overweight is associated with a 5 times increased risk of chronic migraine. Keep well hydrated and drink 6-8 glasses of water per day.   4. Initiate non-pharmacologic measures at the earliest onset of your headache. Rest and quiet environment. Relax and reduce stress. Breathe2Relax is a free app that can instruct you on    some simple relaxtion and breathing techniques. Http://Dawnbuse.com is a    free website that provides teaching videos on relaxation.  Also, there are  many apps that   can be downloaded for "mindful" relaxation.  An app called YOGA NIDRA will help walk you through mindfulness. Another app called Calm can be downloaded to give you a structured mindfulness guide with daily reminders and skill development. Headspace for guided meditation Mindfulness Based Stress Reduction Online Course: www.palousemindfulness.com Cold compresses.   5. Don't wait!! Take the maximum allowable dosage of prescribed medication at the first sign of migraine.   6. Compliance:  Take prescribed medication regularly as directed and at the first sign of a migraine.   7. Communicate:  Call your physician when problems arise, especially if your headaches change, increase in frequency/severity, or become  associated with neurological symptoms (weakness, numbness, slurred speech, etc.). Proceed to emergency room if you experience new or worsening symptoms or symptoms do not resolve, if you have new neurologic symptoms or if headache is severe, or for any concerning symptom.   8. Headache/pain management therapies: Consider various complementary methods, including medication, behavioral therapy, psychological counselling, biofeedback, massage therapy, acupuncture, dry needling, and other modalities.  Such measures may reduce the need for medications. Counseling for pain management, where patients learn to function and ignore/minimize their pain, seems to work very well.   9. Recommend changing family's attention and focus away from patient's headaches. Instead, emphasize daily activities. If first question of day is 'How are your headaches/Do you have a headache today?', then patient will constantly think about headaches, thus making them worse. Goal is to re-direct attention away from headaches, toward daily activities and other distractions.   10. Helpful Websites: www.AmericanHeadacheSociety.org PatentHood.ch www.headaches.org TightMarket.nl www.achenet.org

## 2023-08-11 ENCOUNTER — Ambulatory Visit: Payer: BC Managed Care – PPO | Admitting: Family Medicine

## 2023-08-11 ENCOUNTER — Encounter: Payer: Self-pay | Admitting: Family Medicine

## 2023-08-11 VITALS — BP 112/73 | HR 81 | Ht 67.0 in | Wt 179.5 lb

## 2023-08-11 DIAGNOSIS — G43009 Migraine without aura, not intractable, without status migrainosus: Secondary | ICD-10-CM

## 2023-08-11 MED ORDER — SUMATRIPTAN SUCCINATE 100 MG PO TABS: 100.0000 mg | ORAL_TABLET | Freq: Once | ORAL | 11 refills | Status: DC | PRN

## 2023-08-19 ENCOUNTER — Ambulatory Visit (INDEPENDENT_AMBULATORY_CARE_PROVIDER_SITE_OTHER)
Admission: RE | Admit: 2023-08-19 | Discharge: 2023-08-19 | Disposition: A | Discharge status: Home / Self Care | Payer: BC Managed Care – PPO | Source: Ambulatory Visit | Attending: Family Medicine | Admitting: Family Medicine

## 2023-08-19 ENCOUNTER — Ambulatory Visit: Payer: BC Managed Care – PPO | Admitting: Family Medicine

## 2023-08-19 VITALS — BP 90/60 | HR 102 | Temp 97.7°F | Ht 67.0 in | Wt 179.0 lb

## 2023-08-19 DIAGNOSIS — J181 Lobar pneumonia, unspecified organism: Secondary | ICD-10-CM | POA: Diagnosis not present

## 2023-08-19 DIAGNOSIS — H6992 Unspecified Eustachian tube disorder, left ear: Secondary | ICD-10-CM | POA: Diagnosis not present

## 2023-08-19 DIAGNOSIS — J157 Pneumonia due to Mycoplasma pneumoniae: Secondary | ICD-10-CM | POA: Insufficient documentation

## 2023-08-19 NOTE — Assessment & Plan Note (Signed)
 Acute, postinfectious.  Most likely will resolve with time but she can try Flonase 2 sprays per nostril daily to speed up improvement.  Discussed nasal saline but she has side effects to this.

## 2023-08-19 NOTE — Progress Notes (Signed)
 Patient ID: Yvonne Zhang, female    DOB: [birth year], 18 y.o.   MRN: [id number]  This visit was conducted in person.  BP 90/60 (BP Location: Left Arm, Patient Position: Sitting, Cuff Size: Large)   Pulse (!) 102   Temp 97.7 F (36.5 C) (Temporal)   Ht 5' 7 (1.702 m)   Wt 179 lb (81.2 kg)   LMP 08/17/2023   SpO2 97%   BMI 28.04 kg/m    CC:  Chief Complaint  Patient presents with   Results    Follow up X-Ray     Subjective:   HPI: Yvonne Zhang is a 18 y.o. female presenting on 08/19/2023 for Results (Follow up X-Ray )   Seen at  [facility]  in [place] 11/8, followed up at [facility]  06/23/2023 for pneumonia of left lower lobe due to mycoplasma pneumoniae. Reviewed OV in detail.  Treated  with azithromycin, IV fluids, dexamethazone injection and zofran  Later in follow up received prednisone course.  Follow up CXR was recommended to confirm resolution. Today she reports she has had resolution of cough, no SOB, no wheeze.  No fever. Left ear slightly achy, no pain.  She is not using nasal saline.  She is a nonsmoker, no history of chronic lung disease.  Relevant past medical, surgical, family and social history reviewed and updated as indicated. Interim medical history since our last visit reviewed. Allergies and medications reviewed and updated. Outpatient Medications Prior to Visit  Medication Sig Dispense Refill   acetaminophen  (TYLENOL ) 325 MG tablet Take 2 tablets (650 mg total) by mouth every 6 (six) hours as needed.     Ascorbic Acid (VITAMIN C GUMMIE PO) Take 2 each by mouth daily.     fexofenadine (ALLEGRA) 30 MG/5ML suspension Take 60 mg by mouth daily as needed (for allergies).     ibuprofen  (ADVIL ,MOTRIN ) 200 MG tablet Take 2 tablets (400 mg total) by mouth every 6 (six) hours as needed (for headaches or pain). 30 tablet 0   SUMAtriptan  (IMITREX ) 100 MG tablet Take 1 tablet (100 mg total) by mouth once as needed for up to 1  dose for migraine. May repeat in 2 hours if headache persists or recurs. 10 tablet 11   No facility-administered medications prior to visit.     Per HPI unless specifically indicated in ROS section below Review of Systems  Constitutional:  Negative for fatigue and fever.  HENT:  Negative for congestion.   Eyes:  Negative for pain.  Respiratory:  Negative for cough and shortness of breath.   Cardiovascular:  Negative for chest pain, palpitations and leg swelling.  Gastrointestinal:  Negative for abdominal pain.  Genitourinary:  Negative for dysuria and vaginal bleeding.  Musculoskeletal:  Negative for back pain.  Neurological:  Negative for syncope, light-headedness and headaches.  Psychiatric/Behavioral:  Negative for dysphoric mood.    Objective:  BP 90/60 (BP Location: Left Arm, Patient Position: Sitting, Cuff Size: Large)   Pulse (!) 102   Temp 97.7 F (36.5 C) (Temporal)   Ht 5' 7 (1.702 m)   Wt 179 lb (81.2 kg)   LMP 08/17/2023   SpO2 97%   BMI 28.04 kg/m   Wt Readings from Last 3 Encounters:  08/19/23 179 lb (81.2 kg) (94%, Z= 1.57)*  08/11/23 179 lb 8 oz (81.4 kg) (94%, Z= 1.58)*  03/11/23 184 lb 9.6 oz (83.7 kg) (96%, Z= 1.70)*   * Growth percentiles are based on CDC (Girls,  2-20 Years) data.      Physical Exam Constitutional:      General: She is not in acute distress.    Appearance: Normal appearance. She is well-developed. She is not ill-appearing or toxic-appearing.  HENT:     Head: Normocephalic.     Right Ear: Hearing, tympanic membrane, ear canal and external ear normal. Tympanic membrane is not erythematous, retracted or bulging.     Left Ear: Hearing, ear canal and external ear normal. A middle ear effusion is present. Tympanic membrane is not erythematous, retracted or bulging.     Nose: No mucosal edema or rhinorrhea.     Right Sinus: No maxillary sinus tenderness or frontal sinus tenderness.     Left Sinus: No maxillary sinus tenderness or frontal  sinus tenderness.     Mouth/Throat:     Pharynx: Uvula midline.  Eyes:     General: Lids are normal. Lids are everted, no foreign bodies appreciated.     Conjunctiva/sclera: Conjunctivae normal.     Pupils: Pupils are equal, round, and reactive to light.  Neck:     Thyroid: No thyroid mass or thyromegaly.     Vascular: No carotid bruit.     Trachea: Trachea normal.  Cardiovascular:     Rate and Rhythm: Normal rate and regular rhythm.     Pulses: Normal pulses.     Heart sounds: Normal heart sounds, S1 normal and S2 normal. No murmur heard.    No friction rub. No gallop.  Pulmonary:     Effort: Pulmonary effort is normal. No tachypnea or respiratory distress.     Breath sounds: Normal breath sounds. No decreased breath sounds, wheezing, rhonchi or rales.  Abdominal:     General: Bowel sounds are normal.     Palpations: Abdomen is soft.     Tenderness: There is no abdominal tenderness.  Musculoskeletal:     Cervical back: Normal range of motion and neck supple.  Skin:    General: Skin is warm and dry.     Findings: No rash.  Neurological:     Mental Status: She is alert.  Psychiatric:        Mood and Affect: Mood is not anxious or depressed.        Speech: Speech normal.        Behavior: Behavior normal. Behavior is cooperative.        Thought Content: Thought content normal.        Judgment: Judgment normal.       Results for orders placed or performed in visit on 01/21/23  TSH   Collection Time: 01/21/23  7:35 AM  Result Value Ref Range   TSH 2.07 0.40 - 5.00 uIU/mL  Hemoglobin A1c   Collection Time: 01/21/23  7:35 AM  Result Value Ref Range   Hgb A1c MFr Bld 5.6 4.6 - 6.5 %  Comprehensive metabolic panel   Collection Time: 01/21/23  7:35 AM  Result Value Ref Range   Sodium 138 135 - 145 mEq/L   Potassium 3.8 3.5 - 5.1 mEq/L   Chloride 103 96 - 112 mEq/L   CO2 27 19 - 32 mEq/L   Glucose, Bld 94 70 - 99 mg/dL   BUN 15 6 - 23 mg/dL   Creatinine, Ser 9.10 0.40 -  1.20 mg/dL   Total Bilirubin 0.9 0.2 - 1.2 mg/dL   Alkaline Phosphatase 51 47 - 119 U/L   AST 25 0 - 37 U/L   ALT 30 0 -  35 U/L   Total Protein 7.0 6.0 - 8.3 g/dL   Albumin 4.6 3.5 - 5.2 g/dL   GFR 05.92 >39.99 mL/min   Calcium 9.3 8.4 - 10.5 mg/dL  VITAMIN D  25 Hydroxy (Vit-D Deficiency, Fractures)   Collection Time: 01/21/23  7:35 AM  Result Value Ref Range   VITD 29.54 (L) 30.00 - 100.00 ng/mL  IBC + Ferritin   Collection Time: 01/21/23  7:35 AM  Result Value Ref Range   Iron 92 42 - 145 ug/dL   Transferrin 713.9 787.9 - 360.0 mg/dL   Saturation Ratios 76.9 20.0 - 50.0 %   Ferritin 21.3 10.0 - 291.0 ng/mL   TIBC 400.4 250.0 - 450.0 mcg/dL  CBC with Differential/Platelet   Collection Time: 01/21/23  7:35 AM  Result Value Ref Range   WBC 11.4 4.5 - 13.5 K/uL   RBC 4.80 3.80 - 5.70 Mil/uL   Hemoglobin 13.3 12.0 - 16.0 g/dL   HCT 59.7 63.9 - 50.9 %   MCV 83.7 78.0 - 98.0 fl   MCHC 33.0 31.0 - 37.0 g/dL   RDW 86.9 88.5 - 84.4 %   Platelets 236.0 150.0 - 575.0 K/uL   Neutrophils Relative % 64.8 43.0 - 71.0 %   Lymphocytes Relative 26.4 24.0 - 48.0 %   Monocytes Relative 7.0 3.0 - 12.0 %   Eosinophils Relative 1.4 0.0 - 5.0 %   Basophils Relative 0.4 0.0 - 3.0 %   Neutro Abs 7.3 1.4 - 7.7 K/uL   Lymphs Abs 3.0 0.7 - 4.0 K/uL   Monocytes Absolute 0.8 0.1 - 1.0 K/uL   Eosinophils Absolute 0.2 0.0 - 0.7 K/uL   Basophils Absolute 0.1 0.0 - 0.1 K/uL    Assessment and Plan  Pneumonia of left lower lobe due to Mycoplasma pneumoniae Assessment & Plan: Acute, resolution of symptoms.  Will perform chest x-ray to ensure resolution of consolidation.  Orders: -     DG Chest 2 View; Future  Dysfunction of left eustachian tube Assessment & Plan: Acute, postinfectious.  Most likely will resolve with time but she can try Flonase 2 sprays per nostril daily to speed up improvement.  Discussed nasal saline but she has side effects to this.     No follow-ups on file.   Greig Ring, MD

## 2023-08-19 NOTE — Assessment & Plan Note (Signed)
 Acute, resolution of symptoms.  Will perform chest x-ray to ensure resolution of consolidation.

## 2023-12-06 DIAGNOSIS — Z23 Encounter for immunization: Secondary | ICD-10-CM | POA: Diagnosis not present

## 2023-12-06 DIAGNOSIS — Z6827 Body mass index (BMI) 27.0-27.9, adult: Secondary | ICD-10-CM | POA: Diagnosis not present

## 2023-12-06 DIAGNOSIS — T148XXA Other injury of unspecified body region, initial encounter: Secondary | ICD-10-CM | POA: Diagnosis not present

## 2023-12-13 DIAGNOSIS — H1032 Unspecified acute conjunctivitis, left eye: Secondary | ICD-10-CM | POA: Diagnosis not present

## 2023-12-13 DIAGNOSIS — Z6827 Body mass index (BMI) 27.0-27.9, adult: Secondary | ICD-10-CM | POA: Diagnosis not present

## 2024-01-19 ENCOUNTER — Ambulatory Visit: Payer: Self-pay | Admitting: General Practice

## 2024-01-19 ENCOUNTER — Ambulatory Visit: Admitting: General Practice

## 2024-01-19 ENCOUNTER — Encounter: Payer: Self-pay | Admitting: General Practice

## 2024-01-19 VITALS — BP 108/78 | HR 77 | Temp 97.7°F | Ht 67.0 in | Wt 186.0 lb

## 2024-01-19 DIAGNOSIS — J029 Acute pharyngitis, unspecified: Secondary | ICD-10-CM | POA: Diagnosis not present

## 2024-01-19 DIAGNOSIS — R079 Chest pain, unspecified: Secondary | ICD-10-CM | POA: Diagnosis not present

## 2024-01-19 DIAGNOSIS — J069 Acute upper respiratory infection, unspecified: Secondary | ICD-10-CM | POA: Diagnosis not present

## 2024-01-19 LAB — CBC
HCT: 41.2 % (ref 36.0–46.0)
Hemoglobin: 14 g/dL (ref 12.0–15.0)
MCHC: 34.1 g/dL (ref 30.0–36.0)
MCV: 82 fl (ref 78.0–100.0)
Platelets: 213 10*3/uL (ref 150.0–400.0)
RBC: 5.03 Mil/uL (ref 3.87–5.11)
RDW: 13.1 % (ref 11.5–14.6)
WBC: 9.5 10*3/uL (ref 4.5–10.5)

## 2024-01-19 LAB — POCT RAPID STREP A (OFFICE): Rapid Strep A Screen: NEGATIVE

## 2024-01-19 LAB — COMPREHENSIVE METABOLIC PANEL WITH GFR
ALT: 17 U/L (ref 0–35)
AST: 17 U/L (ref 0–37)
Albumin: 4.8 g/dL (ref 3.5–5.2)
Alkaline Phosphatase: 54 U/L (ref 39–117)
BUN: 11 mg/dL (ref 6–23)
CO2: 30 meq/L (ref 19–32)
Calcium: 9.7 mg/dL (ref 8.4–10.5)
Chloride: 100 meq/L (ref 96–112)
Creatinine, Ser: 0.82 mg/dL (ref 0.40–1.20)
GFR: 103.06 mL/min (ref >60.00)
Glucose, Bld: 89 mg/dL (ref 70–99)
Potassium: 4.1 meq/L (ref 3.5–5.1)
Sodium: 138 meq/L (ref 135–145)
Total Bilirubin: 1 mg/dL (ref 0.2–1.2)
Total Protein: 7.7 g/dL (ref 6.0–8.3)

## 2024-01-19 MED ORDER — AMOXICILLIN-POT CLAVULANATE 875-125 MG PO TABS: 1.0000 | ORAL_TABLET | Freq: Two times a day (BID) | ORAL | 0 refills | Status: AC

## 2024-01-19 NOTE — Patient Instructions (Signed)
 Stop by the lab prior to leaving today. I will notify you of your results once received.   Start Augmentin antibiotics. Take 1 tablet by mouth twice daily for 7 days.  Follow up in one week with primary care provider if symptoms are not better or worse.   If chest pain continues, please go to the ER.   It was a pleasure meeting you!

## 2024-01-19 NOTE — Assessment & Plan Note (Signed)
 EKG tracing is personally reviewed.  EKG notes NSR.  No acute changes.  No red flags.  Exam stable.  Suspect could be secondary to URI.  Cbc and CMP pending.   Consider chest x-ray if not better.

## 2024-01-19 NOTE — Assessment & Plan Note (Addendum)
 POC strep- negative.  Home covid test negative. Symptoms suggestive of URI.  Given presentation, suspect bacterial involvement.  she developed chest pain after she was swabbed for strep.  Pain sustained while I was obtaining history.  No difficulty breathing, diaphoresis. She was able to have full conversation.  Neuro exam stable. Lungs clear. EKG tracing is personally reviewed.  EKG notes NSR.  No acute changes.   Low suspicion for cardiac event.  Discussed ER precautions at length with patient.  Verbalizes understanding.  Start Augmentin antibiotics. Take 1 tablet by mouth twice daily for 7 days. Encourage rest, increase fluid intake and over the counter medication for symptom management. Schedule one week f/u with pcp for close follow up.

## 2024-01-19 NOTE — Assessment & Plan Note (Signed)
 POC strep- negative.  Home covid test negative. Symptoms suggestive of URI.  Given presentation, suspect bacterial involvement.  she developed chest pain after she was swabbed for strep.  Pain sustained while I was obtaining history.  No difficulty breathing, diaphoresis. She was able to have full conversation.  Neuro exam stable. Lungs clear. EKG tracing is personally reviewed.  EKG notes NSR.  No acute changes.   Low suspicion for cardiac event.  Discussed ER precautions at length with patient.  Verbalizes understanding.  Start Augmentin antibiotics. Take 1 tablet by mouth twice daily for 7 days. Encourage rest, increase fluid intake and over the counter medication for symptom management. Schedule one week f/u with pcp for close follow up.

## 2024-01-19 NOTE — Progress Notes (Signed)
 Established Patient Office Visit  Subjective   Patient ID: Yvonne Zhang, female    DOB: [birth year]  Age: 18 y.o. MRN: [id number]  Chief Complaint  Patient presents with   Respiratory Illness    Started around 01-14-24 in the evening. Woke up the next day with scratchy throat, fatigue/malaise. Now has a headache, sore throat not as bad, sneezing, and stuffy nose. Did a home covid test and it was negative. States she has a pain in her upper left chest that just started about 5 mins ago. She also states her dad got sick 2 days after her and his Covid test was negative.     HPI  Yvonne Zhang is a 18 year old female, patient of Herby Lolling, MD with past medical history of chronic headache, IIH, presents today for an acute visit.   Cough: symptom onset was 01/14/24, later in the day. 01/15/24 developed scratchy throat, fatigue and congestion.  Saturday she felt better. Sunday she started she felt worst again.  She had nausea last night and had some old Promethazine 25 mg from November. She reports that it has resolved. Denies any productive cough, chills.   Feels feeling hot and checked her temperature and it was 95-97 F orally.   Home covid test was negative. He dad also got sick and was negative for covid.  Has been taking tylenol , allegra and however, took zyrtec yesterday.  No hx of asthma or smoking.  Had pneumonia in November. Chest xray completed on 08/19/23 was negative.   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 No pertinent surgical history. Allergies  Allergen Reactions   Other     Seasonal Allergies           01/19/2024   10:04 AM 08/19/2023    2:28 PM 07/26/2022    3:38 PM  Depression screen PHQ 2/9  Decreased Interest 0 0 0  Down, Depressed, Hopeless 0 0 0  PHQ - 2 Score 0 0 0        No data to display            Review of Systems  Constitutional:  Negative for chills and fever.  HENT:  Positive for congestion and sore throat. Negative for ear pain.   Respiratory:  Negative for cough, shortness of breath and wheezing.   Cardiovascular:  Positive for chest pain.  Gastrointestinal:  Positive for nausea. Negative for abdominal pain, constipation, diarrhea, heartburn and vomiting.  Genitourinary:  Negative for dysuria, frequency and urgency.  Neurological:  Positive for headaches. Negative for dizziness.  Endo/Heme/Allergies:  Negative for polydipsia.  Psychiatric/Behavioral:  Negative for depression and suicidal ideas. The patient is not nervous/anxious.       Objective:     BP 108/78 (BP Location: Left Arm, Patient Position: Sitting, Cuff Size:  Normal)   Pulse 77   Temp 97.7 F (36.5 C) (Oral)   Ht 5\' 7"  (1.702 m)   Wt 186 lb (84.4 kg)   LMP 01/02/2024 (Approximate)   SpO2 97%   BMI 29.13 kg/m  BP Readings from Last 3 Encounters:  01/19/24 108/78  08/19/23 90/60  08/11/23 112/73   Wt Readings from Last 3 Encounters:  01/19/24 186 lb (84.4 kg)  08/19/23 179 lb (81.2 kg) (94%, Z= 1.57)*  08/11/23 179 lb 8 oz (81.4 kg) (94%, Z= 1.58)*   * Growth percentiles are based on CDC (Girls, 2-20 Years) data.      Physical Exam Vitals and nursing note reviewed.  Constitutional:      Appearance: Normal appearance.  HENT:     Right Ear: Hearing, tympanic membrane, ear  canal and external ear normal.     Left Ear: Hearing, ear canal and external ear normal. Tympanic membrane is erythematous.     Nose:     Right Sinus: No maxillary sinus tenderness or frontal sinus tenderness.     Left Sinus: No maxillary sinus tenderness or frontal sinus tenderness.     Mouth/Throat:     Mouth: Mucous membranes are moist.     Tongue: No lesions. Tongue does not deviate from midline.     Palate: No mass and lesions.     Pharynx: Oropharynx is clear.  Cardiovascular:     Rate and Rhythm: Normal rate and regular rhythm.     Pulses: Normal pulses.     Heart sounds: Normal heart sounds.  Pulmonary:     Effort: Pulmonary effort is normal.     Breath sounds: Normal breath sounds.  Neurological:     Mental Status: She is alert and oriented to person, place, and time.  Psychiatric:        Mood and Affect: Mood normal.        Behavior: Behavior normal.        Thought Content: Thought content normal.        Judgment: Judgment normal.      Results for orders placed or performed in visit on 01/19/24  Rapid Strep A  Result Value Ref Range   Rapid Strep A Screen Negative Negative       The ASCVD Risk score (Arnett DK, et al., 2019) failed to calculate for the following reasons:   The 2019 ASCVD risk score is only valid for ages 62 to 66    Assessment & Plan:  Upper respiratory tract infection, unspecified type Assessment & Plan: POC strep- negative.  Home covid test negative. Symptoms suggestive of URI.  Given presentation, suspect bacterial involvement.  she developed chest pain after she was swabbed for strep.  Pain sustained while I was obtaining history.  No difficulty breathing, diaphoresis. She was able to have full conversation.  Neuro exam stable. Lungs clear. EKG tracing is personally reviewed.  EKG notes NSR.  No acute changes.   Low suspicion for cardiac event.  Discussed ER precautions at length with patient.  Verbalizes understanding.  Start  Augmentin antibiotics. Take 1 tablet by mouth twice daily for 7 days. Encourage rest, increase fluid intake and over the counter medication for symptom management. Schedule one week f/u with pcp for close follow up.   Sore throat Assessment & Plan: POC strep- negative.  Home covid test negative. Symptoms suggestive of URI.  Given presentation, suspect bacterial involvement.  she developed chest pain after she was swabbed for strep.  Pain sustained  while I was obtaining history.  No difficulty breathing, diaphoresis. She was able to have full conversation.  Neuro exam stable. Lungs clear. EKG tracing is personally reviewed.  EKG notes NSR.  No acute changes.   Low suspicion for cardiac event.  Discussed ER precautions at length with patient.  Verbalizes understanding.  Start Augmentin antibiotics. Take 1 tablet by mouth twice daily for 7 days. Encourage rest, increase fluid intake and over the counter medication for symptom management. Schedule one week f/u with pcp for close follow up.  Orders: -     POCT rapid strep A -     Amoxicillin-Pot Clavulanate; Take 1 tablet by mouth 2 (two) times daily for 7 days.  Dispense: 14 tablet; Refill: 0  Chest pain, unspecified type Assessment & Plan: EKG tracing is personally reviewed.  EKG notes NSR.  No acute changes.  No red flags.  Exam stable.  Suspect could be secondary to URI.  Cbc and CMP pending.   Consider chest x-ray if not better.   Orders: -     EKG 12-Lead -     CBC -     Comprehensive metabolic panel with GFR     Return in about 1 week (around 01/26/2024) for chest pain- schedule with PCP. Jolanda Nation, NP

## 2024-07-17 DIAGNOSIS — J069 Acute upper respiratory infection, unspecified: Secondary | ICD-10-CM | POA: Diagnosis not present

## 2024-07-17 DIAGNOSIS — Z6829 Body mass index (BMI) 29.0-29.9, adult: Secondary | ICD-10-CM | POA: Diagnosis not present

## 2024-08-10 ENCOUNTER — Ambulatory Visit: Payer: BC Managed Care – PPO | Admitting: Family Medicine

## 2024-08-10 ENCOUNTER — Encounter: Payer: Self-pay | Admitting: Family Medicine

## 2024-08-10 VITALS — BP 100/78 | HR 59 | Ht 67.0 in | Wt 200.0 lb

## 2024-08-10 DIAGNOSIS — G43009 Migraine without aura, not intractable, without status migrainosus: Secondary | ICD-10-CM

## 2024-08-10 MED ORDER — SUMATRIPTAN SUCCINATE 100 MG PO TABS: 100.0000 mg | ORAL_TABLET | Freq: Once | ORAL | 11 refills | Status: AC | PRN

## 2024-08-10 NOTE — Patient Instructions (Signed)
 Below is our plan:  We will continue sumatriptan  as needed. Let me know if you do move and I can send a referral to [place]  for new neurology provider.    Please make sure you are staying well hydrated. I recommend 50-60 ounces daily. Well balanced diet and regular exercise encouraged. Consistent sleep schedule with 6-8 hours recommended.   Please continue follow up with care team as directed.   Follow up with me in 1 year   You may receive a survey regarding today's visit. I encourage you to leave honest feed back as I do use this information to improve patient care. Thank you for seeing me today!   GENERAL HEADACHE INFORMATION:   Natural supplements: Magnesium Oxide or Magnesium Glycinate 500 mg at bed (up to 800 mg daily) Coenzyme Q10 300 mg in AM Vitamin B2- 200 mg twice a day   Add 1 supplement at a time since even natural supplements can have undesirable side effects. You can sometimes buy supplements cheaper (especially Coenzyme Q10) at www.webmailguide.co.za or at [facility].  Migraine with aura: There is increased risk for stroke in women with migraine with aura and a contraindication for the combined contraceptive pill for use by women who have migraine with aura. The risk for women with migraine without aura is lower. However other risk factors like smoking are far more likely to increase stroke risk than migraine. There is a recommendation for no smoking and for the use of OCPs without estrogen such as progestogen only pills particularly for women with migraine with aura.SABRA People who have migraine headaches with auras may be 3 times more likely to have a stroke caused by a blood clot, compared to migraine patients who don't see auras. Women who take hormone-replacement therapy may be 30 percent more likely to suffer a clot-based stroke than women not taking medication containing estrogen. Other risk factors like smoking and high blood pressure may be  much more important.    Vitamins and  herbs that show potential:   Magnesium: Magnesium (250 mg twice a day or 500 mg at bed) has a relaxant effect on smooth muscles such as blood vessels. Individuals suffering from frequent or daily headache usually have low magnesium levels which can be increase with daily supplementation of 400-750 mg. Three trials found 40-90% average headache reduction  when used as a preventative. Magnesium may help with headaches are aura, the best evidence for magnesium is for migraine with aura is its thought to stop the cortical spreading depression we believe is the pathophysiology of migraine aura.Magnesium also demonstrated the benefit in menstrually related migraine.  Magnesium is part of the messenger system in the serotonin cascade and it is a good muscle relaxant.  It is also useful for constipation which can be a side effect of other medications used to treat migraine. Good sources include nuts, whole grains, and tomatoes. Side Effects: loose stool/diarrhea  Riboflavin (vitamin B 2) 200 mg twice a day. This vitamin assists nerve cells in the production of ATP a principal energy storing molecule.  It is necessary for many chemical reactions in the body.  There have been at least 3 clinical trials of riboflavin using 400 mg per day all of which suggested that migraine frequency can be decreased.  All 3 trials showed significant improvement in over half of migraine sufferers.  The supplement is found in bread, cereal, milk, meat, and poultry.  Most Americans get more riboflavin than the recommended daily allowance, however riboflavin deficiency is  not necessary for the supplements to help prevent headache. Side effects: energizing, green urine   Coenzyme Q10: This is present in almost all cells in the body and is critical component for the conversion of energy.  Recent studies have shown that a nutritional supplement of CoQ10 can reduce the frequency of migraine attacks by improving the energy production of cells as  with riboflavin.  Doses of 150 mg twice a day have been shown to be effective.   Melatonin: Increasing evidence shows correlation between melatonin secretion and headache conditions.  Melatonin supplementation has decreased headache intensity and duration.  It is widely used as a sleep aid.  Sleep is natures way of dealing with migraine.  A dose of 3 mg is recommended to start for headaches including cluster headache. Higher doses up to 15 mg has been reviewed for use in Cluster headache and have been used. The rationale behind using melatonin for cluster is that many theories regarding the cause of Cluster headache center around the disruption of the normal circadian rhythm in the brain.  This helps restore the normal circadian rhythm.   HEADACHE DIET: Foods and beverages which may trigger migraine Note that only 20% of headache patients are food sensitive. You will know if you are food sensitive if you get a headache consistently 20 minutes to 2 hours after eating a certain food. Only cut out a food if it causes headaches, otherwise you might remove foods you enjoy! What matters most for diet is to eat a well balanced healthy diet full of vegetables and low fat protein, and to not miss meals.   Chocolate, other sweets ALL cheeses except cottage and cream cheese Dairy products, yogurt, sour cream, ice cream Liver Meat extracts (Bovril, Marmite, meat tenderizers) Meats or fish which have undergone aging, fermenting, pickling or smoking. These include: Hotdogs,salami,Lox,sausage, mortadellas,smoked salmon, pepperoni, Pickled herring Pods of broad bean (English beans, Chinese pea pods, Italian (fava) beans, lima and navy beans Ripe avocado, ripe banana Yeast extracts or active yeast preparations such as Brewer's or Fleishman's (commercial bakes goods are permitted) Tomato based foods, pizza (lasagna, etc.)   MSG (monosodium glutamate) is disguised as many things; look for these common  aliases: Monopotassium glutamate Autolysed yeast Hydrolysed protein Sodium caseinate flavorings all natural preservatives Nutrasweet   Avoid all other foods that convincingly provoke headaches.   Resources: The Dizzy Bluford Aid Your Headache Diet, migrainestrong.com  https://zamora-andrews.com/   Caffeine and Migraine For patients that have migraine, caffeine intake more than 3 days per week can lead to dependency and increased migraine frequency. I would recommend cutting back on your caffeine intake as best you can. The recommended amount of caffeine is 200-300 mg daily, although migraine patients may experience dependency at even lower doses. While you may notice an increase in headache temporarily, cutting back will be helpful for headaches in the long run. For more information on caffeine and migraine, visit: https://americanmigrainefoundation.org/resource-library/caffeine-and-migraine/   Headache Prevention Strategies:   1. Maintain a headache diary; learn to identify and avoid triggers.  - This can be a simple note where you log when you had a headache, associated symptoms, and medications used - There are several smartphone apps developed to help track migraines: Migraine Buddy, Migraine Monitor, Curelator N1-Headache App   Common triggers include: Emotional triggers: Emotional/Upset family or friends Emotional/Upset occupation Business reversal/success Anticipation anxiety Crisis-serious Post-crisis periodNew job/position   Physical triggers: Vacation Day Weekend Strenuous Exercise High Altitude Location New Move Menstrual Day Physical Illness Oversleep/Not enough  sleep Weather changes Light: Photophobia or light sesnitivity treatment involves a balance between desensitization and reduction in overly strong input. Use dark polarized glasses outside, but not inside. Avoid bright or fluorescent light, but do not dim  environment to the point that going into a normally lit room hurts. Consider FL-41 tint lenses, which reduce the most irritating wavelengths without blocking too much light.  These can be obtained at axonoptics.com or theraspecs.com Foods: see list above.   2. Limit use of acute treatments (over-the-counter medications, triptans, etc.) to no more than 2 days per week or 10 days per month to prevent medication overuse headache (rebound headache).     3. Follow a regular schedule (including weekends and holidays): Don't skip meals. Eat a balanced diet. 8 hours of sleep nightly. Minimize stress. Exercise 30 minutes per day. Being overweight is associated with a 5 times increased risk of chronic migraine. Keep well hydrated and drink 6-8 glasses of water per day.   4. Initiate non-pharmacologic measures at the earliest onset of your headache. Rest and quiet environment. Relax and reduce stress. Breathe2Relax is a free app that can instruct you on    some simple relaxtion and breathing techniques. Http://Dawnbuse.com is a    free website that provides teaching videos on relaxation.  Also, there are  many apps that   can be downloaded for mindful relaxation.  An app called YOGA NIDRA will help walk you through mindfulness. Another app called Calm can be downloaded to give you a structured mindfulness guide with daily reminders and skill development. Headspace for guided meditation Mindfulness Based Stress Reduction Online Course: www.palousemindfulness.com Cold compresses.   5. Don't wait!! Take the maximum allowable dosage of prescribed medication at the first sign of migraine.   6. Compliance:  Take prescribed medication regularly as directed and at the first sign of a migraine.   7. Communicate:  Call your physician when problems arise, especially if your headaches change, increase in frequency/severity, or become associated with neurological symptoms (weakness, numbness, slurred speech, etc.).  Proceed to emergency room if you experience new or worsening symptoms or symptoms do not resolve, if you have new neurologic symptoms or if headache is severe, or for any concerning symptom.   8. Headache/pain management therapies: Consider various complementary methods, including medication, behavioral therapy, psychological counselling, biofeedback, massage therapy, acupuncture, dry needling, and other modalities.  Such measures may reduce the need for medications. Counseling for pain management, where patients learn to function and ignore/minimize their pain, seems to work very well.   9. Recommend changing family's attention and focus away from patient's headaches. Instead, emphasize daily activities. If first question of day is 'How are your headaches/Do you have a headache today?', then patient will constantly think about headaches, thus making them worse. Goal is to re-direct attention away from headaches, toward daily activities and other distractions.   10. Helpful Websites: www.AmericanHeadacheSociety.org patenthood.ch www.headaches.org tightmarket.nl www.achenet.org

## 2024-08-10 NOTE — Progress Notes (Signed)
 "   Chief Complaint  Patient presents with   Follow-up    Pt in room 3. Alone. Here for migraine follow up.    HISTORY OF PRESENT ILLNESS:  08/10/2024 ALL:  Yvonne Zhang returns for follow up for migraines. She was last seen 07/2023 and felt headaches were stable. We switched rizatriptan  to sumatriptan . Since, she reports doing well. She does note a few more headache days a month but feels sumatriptan  works well for abortive therapy. She averages about 4 doses per month. Tolerating well. She is graduating 12/2024 and planning to move to [place] .   08/11/2023 ALL:  Yvonne Zhang is a 18 y.o. female here today for follow up for migraines. She was last seen by Dr Rush 02/2023 and reported migraines were well managed on rizatriptan  as needed.  She reports an intractable headache 05/2023 not responsive to rizatriptan . She was seen by school MD and headache aborted with Toradol . She reports doing well until the past two weeks. She has had 4 headaches over the past two weeks. She took rizatriptan  once and it was effective. She did not bring it home with her for Christmas break. She is currently home from college. 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 No recent ophthalmology exam. No vision changes.    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 MRI brain 12/11/21 was unremarkable. Ophthalmology exam in 2022 was negative for papilledema.    At her last visit she was started on Maxalt  for rescue.   Interval History: Headaches are improved since her last visit. She has not had a migraine for the past couple of months. Maxalt  helps for rescue and she has not had to take a second dose. It does not cause side effects. She does not have any vision changes.     Migraine days per month: 1 Headache free days per month: 29   Current Headache Regimen: Preventative: none Abortive: Maxalt  10 mg PRN     Prior Therapies                                   Advil  Maxalt  10 mg PRN Diamox  Topamax  - paresthesias   REVIEW OF SYSTEMS: Out of a complete 14 system review of symptoms, the patient complains only of the following symptoms, headaches and all other reviewed systems are negative.   ALLERGIES: Allergies  Allergen Reactions   Other     Seasonal Allergies       HOME MEDICATIONS: Outpatient Medications Prior to Visit  Medication Sig Dispense Refill   acetaminophen  (TYLENOL ) 325 MG tablet Take 2 tablets (650 mg total) by mouth every 6 (six) hours as needed.     Ascorbic Acid (VITAMIN C GUMMIE PO) Take 2 each by mouth daily.     cetirizine (ZYRTEC) 10 MG chewable tablet Chew 10 mg by mouth daily.     fexofenadine (ALLEGRA) 30 MG/5ML suspension Take 60 mg by mouth daily as needed (for allergies).     ibuprofen  (ADVIL ,MOTRIN ) 200 MG tablet Take 2 tablets (400 mg total) by mouth every 6 (six) hours as needed (for headaches or pain). 30 tablet 0   SUMAtriptan  (IMITREX ) 100 MG tablet Take 1 tablet (100 mg total) by mouth once as needed for up to 1 dose for migraine. May repeat in 2 hours if headache persists or recurs. 10 tablet  11   No facility-administered medications prior to visit.     PAST MEDICAL HISTORY: Past Medical History:  Diagnosis Date   Headache    Medical history non-contributory      PAST SURGICAL HISTORY: History reviewed. No pertinent surgical history.   FAMILY HISTORY: Family History  Problem Relation Age of Onset   Diabetes Mother    Hypertension Father    Diabetes Father    Hyperlipidemia Father    Hearing loss Maternal Grandmother    Anxiety disorder Maternal Grandmother    Stroke Maternal Grandmother    Hearing loss Maternal Grandfather    Heart attack Paternal Grandmother    Hearing loss Paternal Grandfather      SOCIAL HISTORY: Social History   Socioeconomic History   Marital status: Single    Spouse name: Not on file   Number of children: 0   Years of  education: Not on file   Highest education level: Not on file  Occupational History   Not on file  Tobacco Use   Smoking status: Never   Smokeless tobacco: Never  Vaping Use   Vaping status: Never Used  Substance and Sexual Activity   Alcohol use: No   Drug use: No   Sexual activity: Never  Other Topics Concern   Not on file  Social History Narrative   Yvonne Zhang  goes to [employer], in first year. She is studying  haematologist, minor in psychology.    Diet:  varied but still fairly limited    Exercise: plays soccer   Social Drivers of Health   Tobacco Use: Low Risk (08/10/2024)   Patient History    Smoking Tobacco Use: Never    Smokeless Tobacco Use: Never    Passive Exposure: Not on file  Financial Resource Strain: Not on file  Food Insecurity: Not on file  Transportation Needs: Not on file  Physical Activity: Not on file  Stress: Not on file  Social Connections: Not on file  Intimate Partner Violence: Not on file  Depression (PHQ2-9): Low Risk (01/19/2024)   Depression (PHQ2-9)    PHQ-2 Score: 0  Alcohol Screen: Not on file  Housing: Not on file  Utilities: Not on file  Health Literacy: Not on file     PHYSICAL EXAM  Vitals:   08/10/24 1335  BP: 100/78  Pulse: (!) 59  SpO2: 98%  Weight: 200 lb (90.7 kg)  Height: 5' 7 (1.702 m)    Body mass index is 31.32 kg/m.  Generalized: Well developed, in no acute distress  Cardiology: normal rate and rhythm, no murmur auscultated  Respiratory: clear to auscultation bilaterally    Neurological examination  Mentation: Alert oriented to time, place, history taking. Follows all commands speech and language fluent Cranial nerve II-XII: Pupils were equal round reactive to light. Extraocular movements were full, visual field were full on confrontational test. Facial sensation and strength were normal. Uvula tongue midline. Head turning and shoulder shrug  were normal and symmetric. Motor: The motor testing  reveals 5 over 5 strength of all 4 extremities. Good symmetric motor tone is noted throughout.  Gait and station: Gait is normal.    DIAGNOSTIC DATA (LABS, IMAGING, TESTING) - I reviewed patient records, labs, notes, testing and imaging myself where available.  Lab Results  Component Value Date   WBC 9.5 01/19/2024   HGB 14.0 01/19/2024   HCT 41.2 01/19/2024   MCV 82.0 01/19/2024   PLT 213.0 01/19/2024      Component  Value Date/Time   NA 138 01/19/2024 1041   K 4.1 01/19/2024 1041   CL 100 01/19/2024 1041   CO2 30 01/19/2024 1041   GLUCOSE 89 01/19/2024 1041   BUN 11 01/19/2024 1041   CREATININE 0.82 01/19/2024 1041   CALCIUM 9.7 01/19/2024 1041   PROT 7.7 01/19/2024 1041   ALBUMIN 4.8 01/19/2024 1041   AST 17 01/19/2024 1041   ALT 17 01/19/2024 1041   ALKPHOS 54 01/19/2024 1041   BILITOT 1.0 01/19/2024 1041   GFRNONAA NOT CALCULATED 10/12/2016 1609   GFRAA NOT CALCULATED 10/12/2016 1609   No results found for: CHOL, HDL, LDLCALC, LDLDIRECT, TRIG, CHOLHDL Lab Results  Component Value Date   HGBA1C 5.6 01/21/2023   No results found for: VITAMINB12 Lab Results  Component Value Date   TSH 2.07 01/21/2023        No data to display               No data to display           ASSESSMENT AND PLAN  18 y.o. year old female  has a past medical history of Headache and Medical history non-contributory. here with    Migraine without aura and without status migrainosus, not intractable  Yvonne Zhang reports waxing and waning of headaches, but, overall fairly stable. We continue sumatriptan  as needed. Appropriate dosing and possible side effects reviewed. May consider supplements if needed. Healthy lifestyle habits encouraged. She will follow up with PCP as directed. She will return to see me in 1 year, sooner if needed. May send referral to neurology in [place]  if needed. She verbalizes understanding and agreement with this plan.   No orders of  the defined types were placed in this encounter.   Meds ordered this encounter  Medications   SUMAtriptan  (IMITREX ) 100 MG tablet    Sig: Take 1 tablet (100 mg total) by mouth once as needed for migraine. May repeat in 2 hours if headache persists or recurs.    Dispense:  10 tablet    Refill:  11    Supervising Provider:   YAN, YIJUN [[id number]]      Greig Forbes, MSN, FNP-C 08/10/2024, 2:10 PM  [facility] [address] [place], [place] [address] [phone]  "

## 2025-08-22 ENCOUNTER — Ambulatory Visit: Admitting: Family Medicine
